# Patient Record
Sex: Female | Born: 1990 | Race: Black or African American | Hispanic: No | Marital: Single | Smoking: Never smoker
Health system: Southern US, Community
[De-identification: ages and names within clinical notes are randomized; demographics above are authoritative.]

---

## 2015-06-06 ENCOUNTER — Emergency Department (HOSPITAL_COMMUNITY)
Admission: EM | Admit: 2015-06-06 | Discharge: 2015-06-07 | Disposition: A | Discharge status: Home / Self Care | Payer: BLUE CROSS/BLUE SHIELD | Attending: Emergency Medicine | Admitting: Emergency Medicine

## 2015-06-06 ENCOUNTER — Encounter (HOSPITAL_COMMUNITY): Payer: Self-pay | Admitting: *Deleted

## 2015-06-06 DIAGNOSIS — Z3202 Encounter for pregnancy test, result negative: Secondary | ICD-10-CM | POA: Insufficient documentation

## 2015-06-06 DIAGNOSIS — R197 Diarrhea, unspecified: Secondary | ICD-10-CM | POA: Diagnosis not present

## 2015-06-06 DIAGNOSIS — R112 Nausea with vomiting, unspecified: Secondary | ICD-10-CM

## 2015-06-06 DIAGNOSIS — R1010 Upper abdominal pain, unspecified: Secondary | ICD-10-CM | POA: Diagnosis present

## 2015-06-06 DIAGNOSIS — N390 Urinary tract infection, site not specified: Secondary | ICD-10-CM | POA: Insufficient documentation

## 2015-06-06 LAB — CBC
HEMATOCRIT: 36.5 % (ref 36.0–46.0)
Hemoglobin: 11.6 g/dL — ABNORMAL LOW (ref 12.0–15.0)
MCH: 29.2 pg (ref 26.0–34.0)
MCHC: 31.8 g/dL (ref 30.0–36.0)
MCV: 91.9 fL (ref 78.0–100.0)
Platelets: 344 10*3/uL (ref 150–400)
RBC: 3.97 MIL/uL (ref 3.87–5.11)
RDW: 14 % (ref 11.5–15.5)
WBC: 13.6 10*3/uL — AB (ref 4.0–10.5)

## 2015-06-06 LAB — COMPREHENSIVE METABOLIC PANEL
ALT: 51 U/L (ref 14–54)
AST: 56 U/L — AB (ref 15–41)
Albumin: 3.7 g/dL (ref 3.5–5.0)
Alkaline Phosphatase: 63 U/L (ref 38–126)
Anion gap: 6 (ref 5–15)
BUN: 12 mg/dL (ref 6–20)
CHLORIDE: 110 mmol/L (ref 101–111)
CO2: 22 mmol/L (ref 22–32)
Calcium: 8.8 mg/dL — ABNORMAL LOW (ref 8.9–10.3)
Creatinine, Ser: 0.65 mg/dL (ref 0.44–1.00)
Glucose, Bld: 92 mg/dL (ref 65–99)
POTASSIUM: 3.9 mmol/L (ref 3.5–5.1)
SODIUM: 138 mmol/L (ref 135–145)
Total Bilirubin: 0.6 mg/dL (ref 0.3–1.2)
Total Protein: 7.2 g/dL (ref 6.5–8.1)

## 2015-06-06 LAB — POC URINE PREG, ED: Preg Test, Ur: NEGATIVE

## 2015-06-06 LAB — LIPASE, BLOOD: LIPASE: 21 U/L — AB (ref 22–51)

## 2015-06-06 MED ORDER — SODIUM CHLORIDE 0.9 % IV BOLUS (SEPSIS)
1000.0000 mL | Freq: Once | INTRAVENOUS | Status: AC
  Administered 2015-06-07: 1000 mL via INTRAVENOUS

## 2015-06-06 MED ORDER — ONDANSETRON HCL 4 MG/2ML IJ SOLN
4.0000 mg | Freq: Once | INTRAMUSCULAR | Status: AC
  Administered 2015-06-07: 4 mg via INTRAVENOUS
  Filled 2015-06-06: qty 2

## 2015-06-06 MED ORDER — MORPHINE SULFATE (PF) 4 MG/ML IV SOLN
4.0000 mg | Freq: Once | INTRAVENOUS | Status: AC
  Administered 2015-06-07: 4 mg via INTRAVENOUS
  Filled 2015-06-06: qty 1

## 2015-06-06 NOTE — ED Provider Notes (Signed)
CSN: [id number]     Arrival date & time 06/06/15  2156 History   First MD Initiated Contact with Patient 06/06/15 2304     Chief Complaint  Patient presents with  . Abdominal Pain  . Nausea  . Emesis  . Diarrhea     (Consider location/radiation/quality/duration/timing/severity/associated sxs/prior Treatment) HPI  24 year old female present with c/o abd pain.  Pt report since yesterday she has developed gradual onset of abd pain with n/v/d.  Pain started shortly eating a salad from [employer].  Sts pain is a crampy sensation to upper abdomen with nausea.  She vomited once yesterday.  Today while at work her pain persist.  She return home, vomited twice and having some loose stools. Vomitus is non bloody non bilious, loose stools are non bloody non mucousy. Her sxs has been waxing and waning but seems to be improving.  She denies fever, chills, cp, sob, back pain, lower abd pain, dysuria, hematuria, vaginal bleeding or vaginal discharge.  She denies any specific treatment tried.  She has an intact appendix.  No one else around her is sick.  Her LMP is 05/22/2015.  Her abd pain is mild to moderate at this time.   History reviewed. No pertinent past medical history. History reviewed. No pertinent past surgical history. No family history on file. Social History  Substance Use Topics  . Smoking status: Never Smoker   . Smokeless tobacco: None  . Alcohol Use: Yes   OB History    No data available     Review of Systems  All other systems reviewed and are negative.     Allergies  Review of patient's allergies indicates not on file.  Home Medications   Prior to Admission medications   Not on File   BP 123/62 mmHg  Pulse 87  Temp(Src) 97.9 F (36.6 C) (Oral)  Resp 18  Ht  (1.651 m)  SpO2 100%  LMP 05/22/2015 Physical Exam  Constitutional: She appears well-developed and well-nourished. No distress.  HENT:  Head: Atraumatic.  Eyes: Conjunctivae are  normal.  Neck: Neck supple.  Cardiovascular: Normal rate and regular rhythm.   Pulmonary/Chest: Effort normal and breath sounds normal.  Abdominal: Soft. There is tenderness (mild epigastric tenderness.  Negative Murphy sign, no pain at McBurney's point.  ).  Neurological: She is alert.  Skin: No rash noted.  Psychiatric: She has a normal mood and affect.  Nursing note and vitals reviewed.   ED Course  Procedures (including critical care time)  11:36 PM Pt with upper abd pain with n/v/d after eating at [facility].  Suspect viral GI.  Doubt appendicitis/colitis or other acute emergent medical condition.  Will provide IV hydration and sxs treatment.  Work up initiated.   12:17 AM Labs a mostly reassuring. Urine appears cloudy with 11-20 WBC and many bacteria. Nitrite negative, small leukocyte esterase and many squamous epithelial cells. I discussed this finding with patient. Initially patient she does not have a dysuria however now she mentioned that she did have some urinary discomfort and noticed that the urine is cloudy and she is not usual for her. Given the finding and had abdominal discomfort, patient will receive Bactrim for the next 3 days for treatment of suspected UTI. On reexamination patient has no significant abdominal discomfort. After receiving IV fluid and medication patient felt better. She is stable for discharge. She understands return promptly if her condition worsen.  Labs Review Labs Reviewed  LIPASE, BLOOD - Abnormal;  Notable for the following:    Lipase 21 (*)    All other components within normal limits  COMPREHENSIVE METABOLIC PANEL - Abnormal; Notable for the following:    Calcium 8.8 (*)    AST 56 (*)    All other components within normal limits  CBC - Abnormal; Notable for the following:    WBC 13.6 (*)    Hemoglobin 11.6 (*)    All other components within normal limits  URINALYSIS, ROUTINE W REFLEX MICROSCOPIC (NOT AT [facility]) - Abnormal; Notable for the  following:    APPearance CLOUDY (*)    Specific Gravity, Urine 1.034 (*)    Leukocytes, UA SMALL (*)    All other components within normal limits  URINE MICROSCOPIC-ADD ON - Abnormal; Notable for the following:    Squamous Epithelial / LPF MANY (*)    Bacteria, UA MANY (*)    All other components within normal limits  POC URINE PREG, ED    Imaging Review No results found. I have personally reviewed and evaluated these images and lab results as part of my medical decision-making.   EKG Interpretation None      MDM   Final diagnoses:  Nausea vomiting and diarrhea  UTI (lower urinary tract infection)    BP 123/62 mmHg  Pulse 87  Temp(Src) 97.9 F (36.6 C) (Oral)  Resp 18  Ht 5\' 5"  (1.651 m)  SpO2 100%  LMP 05/22/2015     Fayrene HelperBowie Kaire Stary, PA-C 06/07/15 0034  Rolland PorterMark James, MD 06/17/15 [phone]

## 2015-06-06 NOTE — ED Notes (Signed)
Pt complains of abdominal pain, nausea, vomiting, diarrhea since 4PM yesterday. Pt states her stomach started hurting after eating a salad from [employer] restaurant. Pt states she last vomited at 4PM today and states her nausea has decreased since vomiting today.

## 2015-06-07 LAB — URINALYSIS, ROUTINE W REFLEX MICROSCOPIC
BILIRUBIN URINE: NEGATIVE
Glucose, UA: NEGATIVE mg/dL
Hgb urine dipstick: NEGATIVE
Ketones, ur: NEGATIVE mg/dL
Nitrite: NEGATIVE
PH: 6 (ref 5.0–8.0)
Protein, ur: NEGATIVE mg/dL
SPECIFIC GRAVITY, URINE: 1.034 — AB (ref 1.005–1.030)
Urobilinogen, UA: 1 mg/dL (ref 0.0–1.0)

## 2015-06-07 LAB — URINE MICROSCOPIC-ADD ON

## 2015-06-07 MED ORDER — SULFAMETHOXAZOLE-TRIMETHOPRIM 800-160 MG PO TABS: 1.0000 | ORAL_TABLET | Freq: Two times a day (BID) | ORAL | Status: AC

## 2015-06-07 MED ORDER — ONDANSETRON HCL 4 MG PO TABS: 4.0000 mg | ORAL_TABLET | Freq: Four times a day (QID) | ORAL | Status: DC

## 2015-06-07 NOTE — Discharge Instructions (Signed)
Urinary Tract Infection °Urinary tract infections (UTIs) can develop anywhere along your urinary tract. Your urinary tract is your body's drainage system for removing wastes and extra water. Your urinary tract includes two kidneys, two ureters, a bladder, and a urethra. Your kidneys are a pair of bean-shaped organs. Each kidney is about the size of your fist. They are located below your ribs, one on each side of your spine. °CAUSES °Infections are caused by microbes, which are microscopic organisms, including fungi, viruses, and bacteria. These organisms are so small that they can only be seen through a microscope. Bacteria are the microbes that most commonly cause UTIs. °SYMPTOMS  °Symptoms of UTIs may vary by age and gender of the patient and by the location of the infection. Symptoms in young women typically include a frequent and intense urge to urinate and a painful, burning feeling in the bladder or urethra during urination. Older women and men are more likely to be tired, shaky, and weak and have muscle aches and abdominal pain. A fever may mean the infection is in your kidneys. Other symptoms of a kidney infection include pain in your back or sides below the ribs, nausea, and vomiting. °DIAGNOSIS °To diagnose a UTI, your caregiver will ask you about your symptoms. Your caregiver will also ask you to provide a urine sample. The urine sample will be tested for bacteria and white blood cells. White blood cells are made by your body to help fight infection. °TREATMENT  °Typically, UTIs can be treated with medication. Because most UTIs are caused by a bacterial infection, they usually can be treated with the use of antibiotics. The choice of antibiotic and length of treatment depend on your symptoms and the type of bacteria causing your infection. °HOME CARE INSTRUCTIONS °· If you were prescribed antibiotics, take them exactly as your caregiver instructs you. Finish the medication even if you feel better after  you have only taken some of the medication. °· Drink enough water and fluids to keep your urine clear or pale yellow. °· Avoid caffeine, tea, and carbonated beverages. They tend to irritate your bladder. °· Empty your bladder often. Avoid holding urine for long periods of time. °· Empty your bladder before and after sexual intercourse. °· After a bowel movement, women should cleanse from front to back. Use each tissue only once. °SEEK MEDICAL CARE IF:  °· You have back pain. °· You develop a fever. °· Your symptoms do not begin to resolve within 3 days. °SEEK IMMEDIATE MEDICAL CARE IF:  °· You have severe back pain or lower abdominal pain. °· You develop chills. °· You have nausea or vomiting. °· You have continued burning or discomfort with urination. °MAKE SURE YOU:  °· Understand these instructions. °· Will watch your condition. °· Will get help right away if you are not doing well or get worse. °  °This information is not intended to replace advice given to you by your health care provider. Make sure you discuss any questions you have with your health care provider. °  °Document Released: 05/19/2005 Document Revised: 04/30/2015 Document Reviewed: 09/17/2011 °Elsevier Interactive Patient Education ©2016 Elsevier Inc. ° ° °Viral Gastroenteritis °Viral gastroenteritis is also known as stomach flu. This condition affects the stomach and intestinal tract. It can cause sudden diarrhea and vomiting. The illness typically lasts 3 to 8 days. Most people develop an immune response that eventually gets rid of the virus. While this natural response develops, the virus can make you quite ill. °CAUSES  °  Many different viruses can cause gastroenteritis, such as rotavirus or noroviruses. You can catch one of these viruses by consuming contaminated food or water. You may also catch a virus by sharing utensils or other personal items with an infected person or by touching a contaminated surface. °SYMPTOMS  °The most common  symptoms are diarrhea and vomiting. These problems can cause a severe loss of body fluids (dehydration) and a body salt (electrolyte) imbalance. Other symptoms may include: °· Fever. °· Headache. °· Fatigue. °· Abdominal pain. °DIAGNOSIS  °Your caregiver can usually diagnose viral gastroenteritis based on your symptoms and a physical exam. A stool sample may also be taken to test for the presence of viruses or other infections. °TREATMENT  °This illness typically goes away on its own. Treatments are aimed at rehydration. The most serious cases of viral gastroenteritis involve vomiting so severely that you are not able to keep fluids down. In these cases, fluids must be given through an intravenous line (IV). °HOME CARE INSTRUCTIONS  °· Drink enough fluids to keep your urine clear or pale yellow. Drink small amounts of fluids frequently and increase the amounts as tolerated. °· Ask your caregiver for specific rehydration instructions. °· Avoid: °· Foods high in sugar. °· Alcohol. °· Carbonated drinks. °· Tobacco. °· Juice. °· Caffeine drinks. °· Extremely hot or cold fluids. °· Fatty, greasy foods. °· Too much intake of anything at one time. °· Dairy products until 24 to 48 hours after diarrhea stops. °· You may consume probiotics. Probiotics are active cultures of beneficial bacteria. They may lessen the amount and number of diarrheal stools in adults. Probiotics can be found in yogurt with active cultures and in supplements. °· Wash your hands well to avoid spreading the virus. °· Only take over-the-counter or prescription medicines for pain, discomfort, or fever as directed by your caregiver. Do not give aspirin to children. Antidiarrheal medicines are not recommended. °· Ask your caregiver if you should continue to take your regular prescribed and over-the-counter medicines. °· Keep all follow-up appointments as directed by your caregiver. °SEEK IMMEDIATE MEDICAL CARE IF:  °· You are unable to keep fluids  down. °· You do not urinate at least once every 6 to 8 hours. °· You develop shortness of breath. °· You notice blood in your stool or vomit. This may look like coffee grounds. °· You have abdominal pain that increases or is concentrated in one small area (localized). °· You have persistent vomiting or diarrhea. °· You have a fever. °· The patient is a child younger than 3 months, and he or she has a fever. °· The patient is a child older than 3 months, and he or she has a fever and persistent symptoms. °· The patient is a child older than 3 months, and he or she has a fever and symptoms suddenly get worse. °· The patient is a baby, and he or she has no tears when crying. °MAKE SURE YOU:  °· Understand these instructions. °· Will watch your condition. °· Will get help right away if you are not doing well or get worse. °  °This information is not intended to replace advice given to you by your health care provider. Make sure you discuss any questions you have with your health care provider. °  °Document Released: 08/09/2005 Document Revised: 11/01/2011 Document Reviewed: 05/26/2011 °Elsevier Interactive Patient Education ©2016 Elsevier Inc. ° °

## 2016-01-14 ENCOUNTER — Ambulatory Visit (INDEPENDENT_AMBULATORY_CARE_PROVIDER_SITE_OTHER): Payer: BLUE CROSS/BLUE SHIELD | Admitting: Urgent Care

## 2016-01-14 VITALS — BP 110/66 | HR 76 | Temp 98.1°F | Resp 18 | Ht 65.0 in | Wt 233.0 lb

## 2016-01-14 DIAGNOSIS — B3731 Acute candidiasis of vulva and vagina: Secondary | ICD-10-CM

## 2016-01-14 DIAGNOSIS — B373 Candidiasis of vulva and vagina: Secondary | ICD-10-CM

## 2016-01-14 DIAGNOSIS — N898 Other specified noninflammatory disorders of vagina: Secondary | ICD-10-CM

## 2016-01-14 LAB — POCT URINALYSIS DIP (MANUAL ENTRY)
Blood, UA: NEGATIVE
Glucose, UA: NEGATIVE
Ketones, POC UA: NEGATIVE
NITRITE UA: NEGATIVE
Spec Grav, UA: 1.015

## 2016-01-14 LAB — POC MICROSCOPIC URINALYSIS (UMFC)

## 2016-01-14 LAB — POCT WET + KOH PREP: Trich by wet prep: ABSENT

## 2016-01-14 LAB — POCT URINE PREGNANCY: PREG TEST UR: NEGATIVE

## 2016-01-14 MED ORDER — FLUCONAZOLE 150 MG PO TABS: 150.0000 mg | ORAL_TABLET | ORAL | Status: AC

## 2016-01-14 NOTE — Addendum Note (Signed)
Addended by: Damita LackLORING, DONNA S on: 01/14/2016 08:11 PM   Modules accepted: Kipp BroodSmartSet

## 2016-01-14 NOTE — Patient Instructions (Addendum)
   IF you received an x-ray today, you will receive an invoice from [facility]. Please contact [facility] at [phone] with questions or concerns regarding your invoice.   IF you received labwork today, you will receive an invoice from [facility]. Please contact [facility] at [phone] with questions or concerns regarding your invoice.   Our billing staff will not be able to assist you with questions regarding bills from these companies.  You will be contacted with the lab results as soon as they are available. The fastest way to get your results is to activate your My Chart account. Instructions are located on the last page of this paperwork. If you have not heard from us regarding the results in 2 weeks, please contact this office.      Monilial Vaginitis Vaginitis in a soreness, swelling and redness (inflammation) of the vagina and vulva. Monilial vaginitis is not a sexually transmitted infection. CAUSES  Yeast vaginitis is caused by yeast (candida) that is normally found in your vagina. With a yeast infection, the candida has overgrown in number to a point that upsets the chemical balance. SYMPTOMS   White, thick vaginal discharge.  Swelling, itching, redness and irritation of the vagina and possibly the lips of the vagina (vulva).  Burning or painful urination.  Painful intercourse. DIAGNOSIS  Things that may contribute to monilial vaginitis are:  Postmenopausal and virginal states.  Pregnancy.  Infections.  Being tired, sick or stressed, especially if you had monilial vaginitis in the past.  Diabetes. Good control will help lower the chance.  Birth control pills.  Tight fitting garments.  Using bubble bath, feminine sprays, douches or deodorant tampons.  Taking certain medications that kill germs (antibiotics).  Sporadic recurrence can occur if you become ill. TREATMENT  Your caregiver will give you  medication.  There are several kinds of anti monilial vaginal creams and suppositories specific for monilial vaginitis. For recurrent yeast infections, use a suppository or cream in the vagina 2 times a week, or as directed.  Anti-monilial or steroid cream for the itching or irritation of the vulva may also be used. Get your caregiver's permission.  Painting the vagina with methylene blue solution may help if the monilial cream does not work.  Eating yogurt may help prevent monilial vaginitis. HOME CARE INSTRUCTIONS   Finish all medication as prescribed.  Do not have sex until treatment is completed or after your caregiver tells you it is okay.  Take warm sitz baths.  Do not douche.  Do not use tampons, especially scented ones.  Wear cotton underwear.  Avoid tight pants and panty hose.  Tell your sexual partner that you have a yeast infection. They should go to their caregiver if they have symptoms such as mild rash or itching.  Your sexual partner should be treated as well if your infection is difficult to eliminate.  Practice safer sex. Use condoms.  Some vaginal medications cause latex condoms to fail. Vaginal medications that harm condoms are:  Cleocin cream.  Butoconazole (Femstat).  Terconazole (Terazol) vaginal suppository.  Miconazole (Monistat) (may be purchased over the counter). SEEK MEDICAL CARE IF:   You have a temperature by mouth above 102 F (38.9 C).  The infection is getting worse after 2 days of treatment.  The infection is not getting better after 3 days of treatment.  You develop blisters in or around your vagina.  You develop vaginal bleeding, and it is not your menstrual period.  You have   pain when you urinate.  You develop intestinal problems.  You have pain with sexual intercourse.   This information is not intended to replace advice given to you by your health care provider. Make sure you discuss any questions you have with your  health care provider.   Document Released: 05/19/2005 Document Revised: 11/01/2011 Document Reviewed: 02/10/2015 Elsevier Interactive Patient Education 2016 Elsevier Inc.  

## 2016-01-14 NOTE — Progress Notes (Signed)
    MRN: [id number] DOB: 1991-06-13  Subjective:   Samantha Reeves is a 25 y.o. female presenting for chief complaint of Gynecologic Exam  Reports ~1 month history of worsening vaginal discharge and odor. Has a history of yeast infections. She felt like initially her symptoms were similar to the yeast infections she's had before, tried to wait it out but now feels her symptoms are much worse. Discharge is heavy, thick and brown, malodorous, vaginal itching, has to change underwear multiple times per day. Has tried Vagisil, other otc remedies with minimal relief. Denies fever, pelvic pain, n/v, genital rashes, bloody vaginal discharge. Patient is sexually active, uses condoms for protection.  Samantha Reeves currently has no medications in their medication list. Also has No Known Allergies.  Samantha Reeves  has no past medical history on file. Also  has no past surgical history on file.  Objective:   Vitals: BP 110/66 mmHg  Pulse 76  Temp(Src) 98.1 F (36.7 C) (Oral)  Resp 18  Ht 5\' 5"  (1.651 m)  Wt 233 lb (105.688 kg)  BMI 38.77 kg/m2  SpO2 99%  LMP 12/17/2015  Physical Exam  Constitutional: She is oriented to person, place, and time. She appears well-developed and well-nourished.  Cardiovascular: Normal rate, regular rhythm and intact distal pulses.   Pulmonary/Chest: No respiratory distress. She has no wheezes. She has no rales.  Abdominal: Soft. Bowel sounds are normal. She exhibits no distension and no mass. There is no tenderness.  Musculoskeletal: She exhibits no edema.  Neurological: She is alert and oriented to person, place, and time.  Skin: Skin is warm and dry.   Results for orders placed or performed in visit on 01/14/16 (from the past 24 hour(s))  POCT Wet + KOH Prep     Status: Abnormal   Collection Time: 01/14/16  6:24 PM  Result Value Ref Range   Yeast by KOH Present Present, Absent   Yeast by wet prep Present Present, Absent   WBC by wet prep None None, Few, Too numerous to  count   Clue Cells Wet Prep HPF POC None None, Too numerous to count   Trich by wet prep Absent Present, Absent   Bacteria Wet Prep HPF POC Many (A) None, Few, Too numerous to count   Epithelial Cells By [employer]et Pref ([facility]) Many (A) None, Few, Too numerous to count   RBC,UR,HPF,POC None None RBC/hpf  POCT urinalysis dipstick     Status: Abnormal   Collection Time: 01/14/16  6:24 PM  Result Value Ref Range   Color, UA yellow yellow   Clarity, UA clear clear   Glucose, UA negative negative   Bilirubin, UA small (A) negative   Ketones, POC UA negative negative   Spec Grav, UA 1.015    Blood, UA negative negative   pH, UA >=9.0    Protein Ur, POC =100 (A) negative   Urobilinogen, UA >=8.0    Nitrite, UA Negative Negative   Leukocytes, UA Trace (A) Negative   Assessment and Plan :   1. Yeast vaginitis 2. Vaginal discharge - Start diflucan for yeast vaginitis. Recheck in 1 week if no improvement. Labs pending.  Wallis BambergMario Javiana Anwar, PA-C [facility] [phone] 01/14/2016 5:49 PM

## 2016-01-15 LAB — HIV ANTIBODY (ROUTINE TESTING W REFLEX): HIV: NONREACTIVE

## 2016-01-15 LAB — HEMOGLOBIN A1C
Hgb A1c MFr Bld: 4.8 % (ref <5.7)
MEAN PLASMA GLUCOSE: 91 mg/dL

## 2016-01-16 LAB — GC/CHLAMYDIA PROBE AMP
CT PROBE, AMP APTIMA: NOT DETECTED
GC Probe RNA: NOT DETECTED

## 2016-01-16 LAB — RPR

## 2016-01-20 ENCOUNTER — Encounter: Payer: Self-pay | Admitting: Urgent Care

## 2017-12-15 ENCOUNTER — Emergency Department (HOSPITAL_COMMUNITY): Payer: BLUE CROSS/BLUE SHIELD

## 2017-12-15 ENCOUNTER — Emergency Department (HOSPITAL_COMMUNITY)
Admission: EM | Admit: 2017-12-15 | Discharge: 2017-12-15 | Disposition: A | Discharge status: Home / Self Care | Payer: BLUE CROSS/BLUE SHIELD | Attending: Emergency Medicine | Admitting: Emergency Medicine

## 2017-12-15 ENCOUNTER — Encounter (HOSPITAL_COMMUNITY): Payer: Self-pay | Admitting: Emergency Medicine

## 2017-12-15 ENCOUNTER — Other Ambulatory Visit: Payer: Self-pay

## 2017-12-15 DIAGNOSIS — R0602 Shortness of breath: Secondary | ICD-10-CM | POA: Diagnosis not present

## 2017-12-15 DIAGNOSIS — R0789 Other chest pain: Secondary | ICD-10-CM | POA: Insufficient documentation

## 2017-12-15 LAB — CBC
HCT: 38 % (ref 36.0–46.0)
Hemoglobin: 12.4 g/dL (ref 12.0–15.0)
MCH: 30.4 pg (ref 26.0–34.0)
MCHC: 32.6 g/dL (ref 30.0–36.0)
MCV: 93.1 fL (ref 78.0–100.0)
PLATELETS: 298 10*3/uL (ref 150–400)
RBC: 4.08 MIL/uL (ref 3.87–5.11)
RDW: 13.5 % (ref 11.5–15.5)
WBC: 4.4 10*3/uL (ref 4.0–10.5)

## 2017-12-15 LAB — BASIC METABOLIC PANEL
Anion gap: 8 (ref 5–15)
BUN: 11 mg/dL (ref 6–20)
CALCIUM: 9 mg/dL (ref 8.9–10.3)
CO2: 23 mmol/L (ref 22–32)
CREATININE: 0.65 mg/dL (ref 0.44–1.00)
Chloride: 107 mmol/L (ref 101–111)
GFR calc non Af Amer: >60 mL/min (ref >60)
GLUCOSE: 85 mg/dL (ref 65–99)
Potassium: 4 mmol/L (ref 3.5–5.1)
Sodium: 138 mmol/L (ref 135–145)

## 2017-12-15 LAB — I-STAT BETA HCG BLOOD, ED (MC, WL, AP ONLY): I-stat hCG, quantitative: <5 m[IU]/mL (ref <5)

## 2017-12-15 LAB — I-STAT TROPONIN, ED: TROPONIN I, POC: 0.01 ng/mL (ref 0.00–0.08)

## 2017-12-15 NOTE — ED Notes (Signed)
Patient transported to X-ray 

## 2017-12-15 NOTE — ED Provider Notes (Signed)
[facility]-EMERGENCY DEPT Provider Note   CSN: [id number] Arrival date & time: 12/15/17  0251     History   Chief Complaint Chief Complaint  Patient presents with  . Chest Pain    HPI Samantha Reeves is a 27 y.o. female.  Samantha Reeves is a 27 y.o. Female who is otherwise healthy, presents to the ED for evaluation of chest pain shortness of breath.  Patient reports she was working at [employer] yesterday coming helping to lift and load heavy boxes when she started to experience acute onset sharp pain across the top of her chest on both sides.  Pain does not radiate.  Patient reports some associated breath although this could have been because she was just working hard and physically exerting herself.  Patient reports chest pain resolved about an hour after she left work, but she proceeded to the ED for evaluation.  During evaluation patient reports she is chest pain-free and is no longer experiencing any shortness of breath.  She denies any associated lightheadedness, dizziness or syncope.  She has never experienced similar symptoms.  No numbness, tingling or weakness in the extremities.  No fevers, cough, abdominal pain, nausea or vomiting.  Patient is not on any estrogen therapy, denies any recent surgeries or long distance travels, denies lower extremity swelling or pain, no history of PE or DVT.     History reviewed. No pertinent past medical history.  There are no active problems to display for this patient.   History reviewed. No pertinent surgical history.   OB History   None      Home Medications    Prior to Admission medications   Medication Sig Start Date End Date Taking? Authorizing Provider  fluconazole (DIFLUCAN) 150 MG tablet Take 1 tablet (150 mg total) by mouth once a week. Patient not taking: Reported on 12/15/2017 01/14/16   Wallis BambergMani, Mario, PA-C    Family History History reviewed. No pertinent family history.  Social History Social  History   Tobacco Use  . Smoking status: Never Smoker  . Smokeless tobacco: Never Used  Substance Use Topics  . Alcohol use: Yes    Comment: social   . Drug use: Never     Allergies   Patient has no known allergies.   Review of Systems Review of Systems  Constitutional: Negative for chills and fever.  HENT: Negative for congestion, rhinorrhea and sore throat.   Eyes: Negative for visual disturbance.  Respiratory: Positive for shortness of breath. Negative for cough, chest tightness and wheezing.   Cardiovascular: Positive for chest pain. Negative for palpitations and leg swelling.  Gastrointestinal: Negative for abdominal pain, diarrhea, nausea and vomiting.  Genitourinary: Negative for dysuria and frequency.  Musculoskeletal: Negative for arthralgias.  Skin: Negative for color change and rash.  Neurological: Negative for dizziness, syncope and light-headedness.     Physical Exam Updated Vital Signs BP 121/79 (BP Location: Left Arm)   Pulse 63   Temp 98.3 F (36.8 C) (Oral)   Resp 13   Ht 5\' 6"  (1.676 m)   Wt 94.3 kg (208 lb)   LMP 12/01/2017 (Approximate)   SpO2 100%   BMI 33.57 kg/m   Physical Exam  Constitutional: She is oriented to person, place, and time. She appears well-developed and well-nourished. No distress.  HENT:  Head: Normocephalic and atraumatic.  Mouth/Throat: Oropharynx is clear and moist.  Eyes: Right eye exhibits no discharge. Left eye exhibits no discharge.  Neck: Neck supple.  Cardiovascular: Normal rate,  regular rhythm, normal heart sounds and intact distal pulses.  Pulses:      Radial pulses are 2+ on the right side, and 2+ on the left side.       Popliteal pulses are 2+ on the right side, and 2+ on the left side.       Dorsalis pedis pulses are 2+ on the right side, and 2+ on the left side.  Pulmonary/Chest: Effort normal and breath sounds normal. No stridor. No respiratory distress. She has no wheezes. She has no rales.  Respirations  equal and unlabored, patient able to speak in full sentences, lungs clear to auscultation bilaterally  Abdominal: Soft. Bowel sounds are normal. She exhibits no distension and no mass. There is no tenderness. There is no guarding.  Musculoskeletal: She exhibits no edema or deformity.  Bilateral lower extremities without edema or tenderness.  Neurological: She is alert and oriented to person, place, and time. Coordination normal.  Skin: Skin is warm and dry. Capillary refill takes less than 2 seconds. She is not diaphoretic.  Psychiatric: She has a normal mood and affect. Her behavior is normal.  Nursing note and vitals reviewed.    ED Treatments / Results  Labs (all labs ordered are listed, but only abnormal results are displayed) Labs Reviewed  BASIC METABOLIC PANEL  CBC  I-STAT TROPONIN, ED  I-STAT BETA HCG BLOOD, ED ([facility], [facility], [facility] ONLY)    EKG EKG Interpretation  Date/Time:  Thursday December 15 2017 03:06:06 EDT Ventricular Rate:  61 PR Interval:    QRS Duration: 89 QT Interval:  413 QTC Calculation: 416 R Axis:   91 Text Interpretation:  Sinus rhythm Atrial premature complex Borderline right axis deviation ST elev, probable normal early repol pattern No old tracing to compare Confirmed by Rochele Raring [phone]) on 12/15/2017 3:29:04 AM Also confirmed by Ward, Baxter Hire [phone]), editor Barbette Hair [phone])  on 12/15/2017 7:12:07 AM   Radiology No results found.  Procedures Procedures (including critical care time)  Medications Ordered in ED Medications - No data to display   Initial Impression / Assessment and Plan / ED Course  I have reviewed the triage vital signs and the nursing notes.  Pertinent labs & imaging results that were available during my care of the patient were reviewed by me and considered in my medical decision making (see chart for details).  Patient is to be discharged with recommendation to follow up with PCP in regards to today's hospital visit. Chest  pain is not likely of cardiac or pulmonary etiology d/t presentation, PERC negative, VSS, no tracheal deviation, no JVD or new murmur, RRR, breath sounds equal bilaterally, EKG without acute abnormalities, negative troponin, and negative CXR. Pt has been advised to return to the ED if CP becomes exertional, associated with diaphoresis or nausea, radiates to left jaw/arm, worsens or becomes concerning in any way. Pt appears reliable for follow up and is agreeable to discharge.    Final Clinical Impressions(s) / ED Diagnoses   Final diagnoses:  Atypical chest pain  Chest wall pain  Shortness of breath    ED Discharge Orders    None       Legrand Rams 12/15/17 1807    Charlynne Pander, MD 12/16/17 [phone]

## 2017-12-15 NOTE — ED Notes (Signed)
Pt called for xray  No response from lobby

## 2017-12-15 NOTE — Discharge Instructions (Signed)
Your evaluation today is reassuring, chest x-ray, EKG and labs do not suggest an acute problem with your heart or lungs causing your symptoms.  Please follow-up with your primary care doctor regarding this.  Do not hesitate to return to the emergency department if you have recurrent chest pain especially if it is worse with exertion, radiates to the arm neck or jaw, you have shortness of breath with it you feel lightheaded or like you are going to pass out or any other new or concerning symptoms develop.

## 2017-12-15 NOTE — ED Triage Notes (Signed)
Pt is c/o chest pain that started on Wednesday morning while she was at work lifting boxes  States pain was at the top of her chest and was sharp in nature  Pt states the pain comes and goes and she has shortness of breath associated with the pain

## 2017-12-15 NOTE — ED Notes (Signed)
Patient states that pain has subsided and requesting to not have blood work drawn.

## 2018-03-30 ENCOUNTER — Encounter

## 2018-05-18 ENCOUNTER — Other Ambulatory Visit (HOSPITAL_COMMUNITY)
Admission: RE | Admit: 2018-05-18 | Discharge: 2018-05-18 | Disposition: A | Discharge status: Home / Self Care | Payer: BLUE CROSS/BLUE SHIELD | Source: Ambulatory Visit | Attending: Nurse Practitioner | Admitting: Nurse Practitioner

## 2018-05-18 ENCOUNTER — Other Ambulatory Visit: Payer: Self-pay | Admitting: Nurse Practitioner

## 2018-05-18 DIAGNOSIS — Z01419 Encounter for gynecological examination (general) (routine) without abnormal findings: Secondary | ICD-10-CM | POA: Insufficient documentation

## 2018-05-22 LAB — CYTOLOGY - PAP
CHLAMYDIA, DNA PROBE: NEGATIVE
DIAGNOSIS: NEGATIVE
NEISSERIA GONORRHEA: NEGATIVE

## 2018-08-31 DIAGNOSIS — D649 Anemia, unspecified: Secondary | ICD-10-CM | POA: Diagnosis not present

## 2018-08-31 DIAGNOSIS — Z8742 Personal history of other diseases of the female genital tract: Secondary | ICD-10-CM | POA: Diagnosis not present

## 2018-08-31 DIAGNOSIS — Z113 Encounter for screening for infections with a predominantly sexual mode of transmission: Secondary | ICD-10-CM | POA: Diagnosis not present

## 2019-03-03 IMAGING — CR DG CHEST 2V
2 series · 2 of 2 positions shown · non-contrast
Comparison: None.

CLINICAL DATA: Single episode of chest pain yesterday, now
resolved.

EXAM:
CHEST - 2 VIEW

[w chest pa]
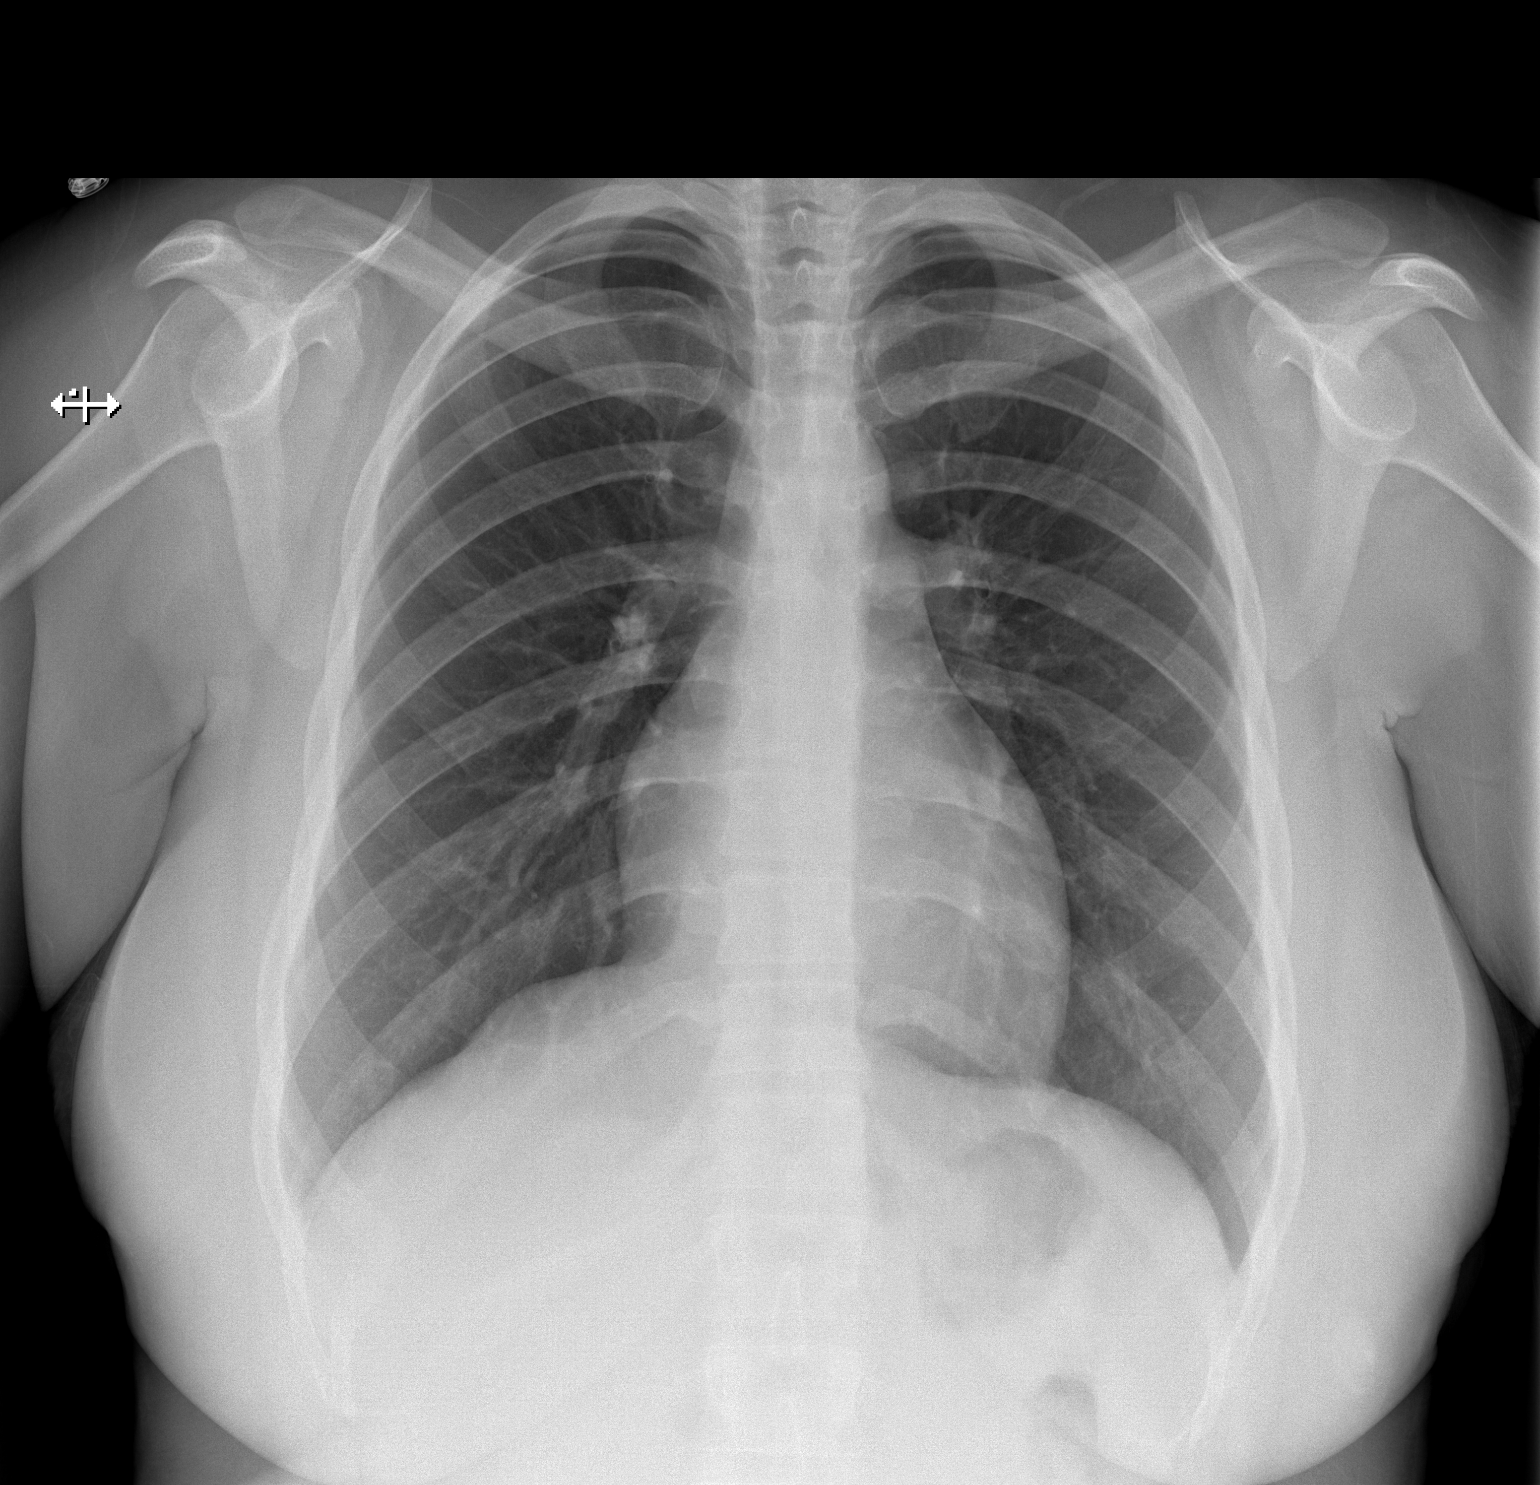

[w chest lat]
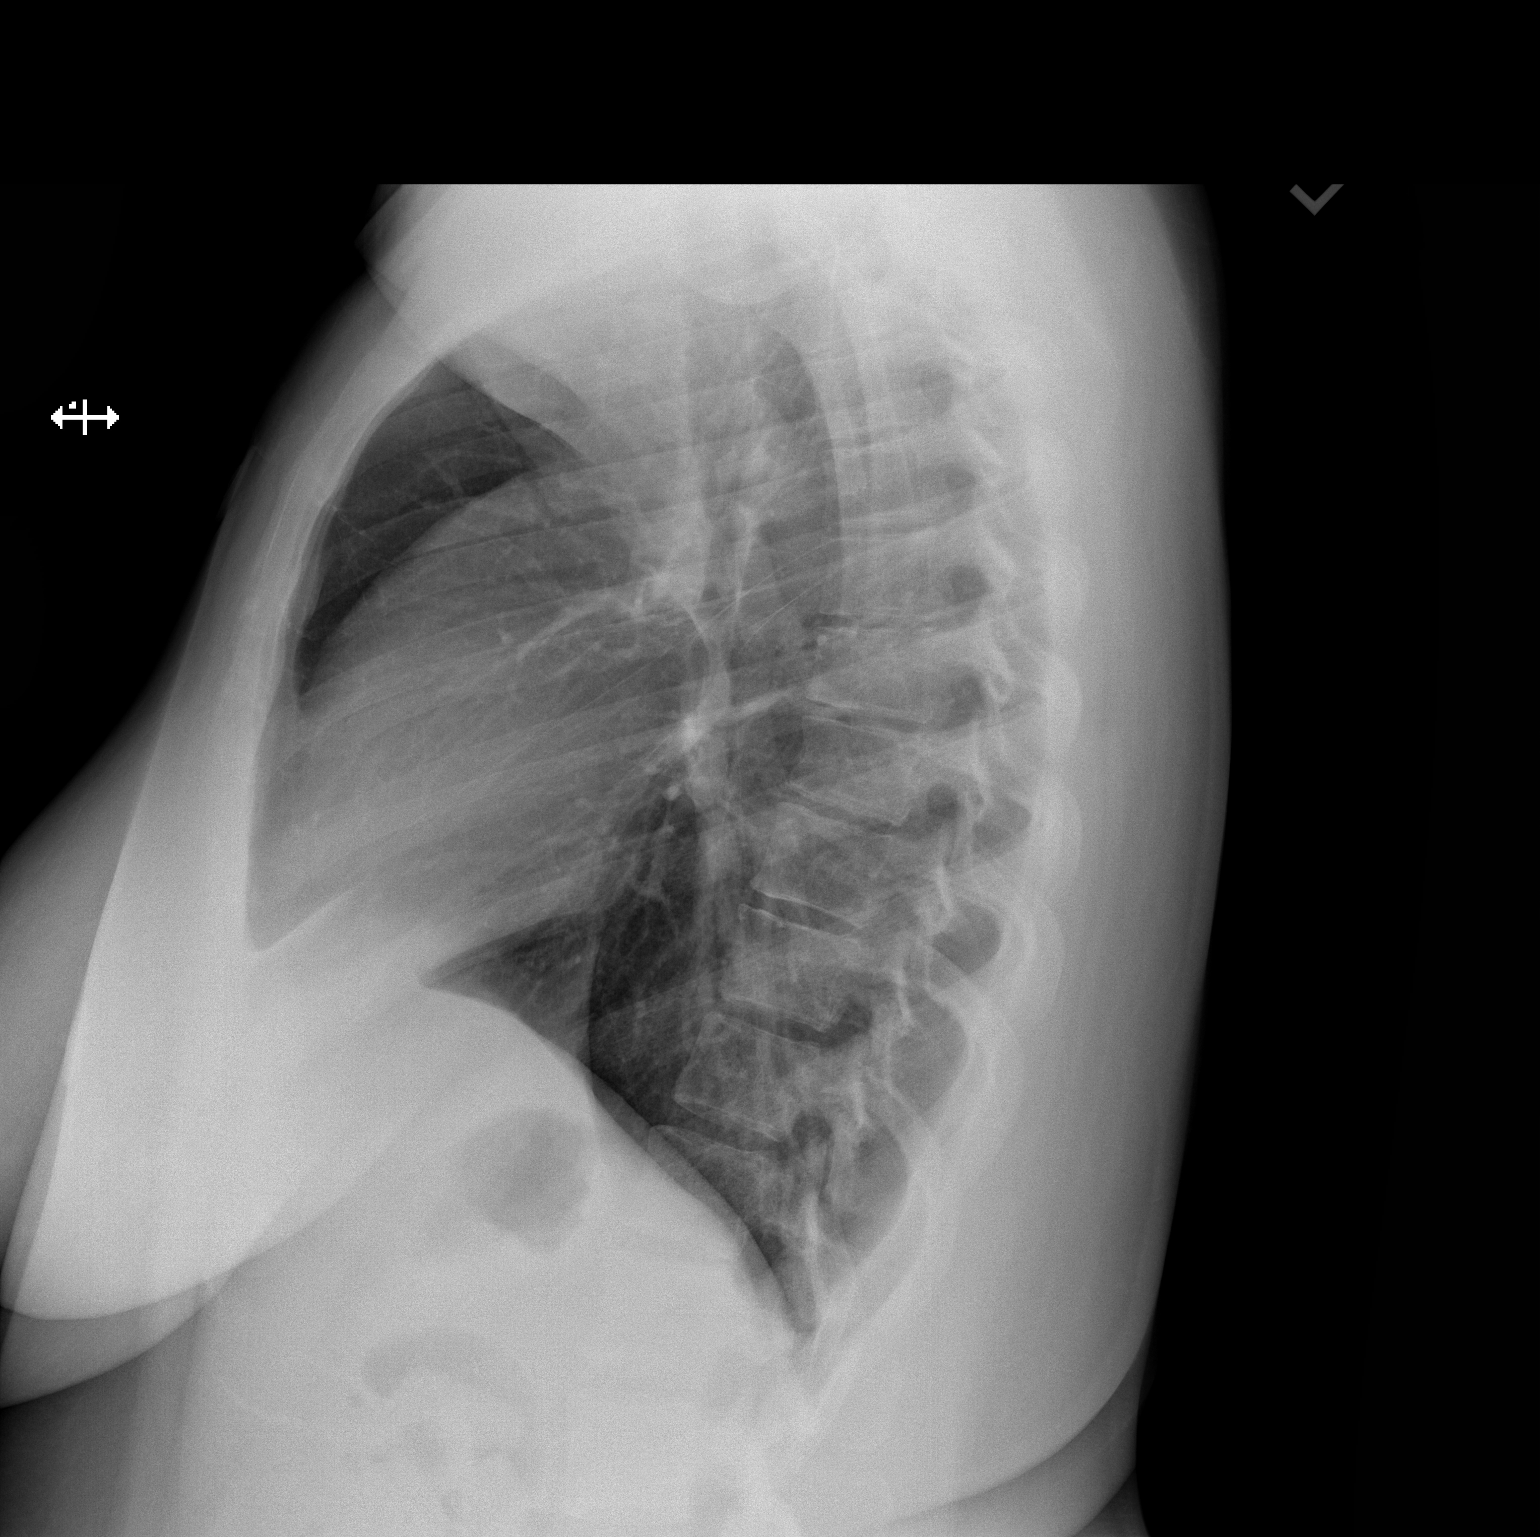

[2 of 2 positions shown; findings below may reference images not displayed]

FINDINGS: The heart size and mediastinal contours are within normal limits.
Both lungs are clear. The visualized skeletal structures are
unremarkable.
IMPRESSION: Normal chest x-ray.
